# Patient Record
Sex: Female | Born: 1970 | Race: Black or African American | Hispanic: No | Marital: Single | Smoking: Never smoker
Health system: Southern US, Community
[De-identification: ages and names within clinical notes are randomized; demographics above are authoritative.]

---

## 2010-10-11 ENCOUNTER — Emergency Department (HOSPITAL_COMMUNITY)
Admission: EM | Admit: 2010-10-11 | Discharge: 2010-10-11 | Disposition: A | Discharge status: Home / Self Care | Payer: BC Managed Care – PPO | Attending: Emergency Medicine | Admitting: Emergency Medicine

## 2010-10-11 DIAGNOSIS — R609 Edema, unspecified: Secondary | ICD-10-CM | POA: Insufficient documentation

## 2010-10-11 DIAGNOSIS — M7989 Other specified soft tissue disorders: Secondary | ICD-10-CM | POA: Insufficient documentation

## 2010-10-11 LAB — POCT I-STAT, CHEM 8
BUN: 13 mg/dL (ref 6–23)
Calcium, Ion: 1.12 mmol/L (ref 1.12–1.32)
Chloride: 105 mEq/L (ref 96–112)
Glucose, Bld: 85 mg/dL (ref 70–99)
TCO2: 22 mmol/L (ref 0–100)

## 2010-10-12 ENCOUNTER — Other Ambulatory Visit: Payer: Self-pay | Admitting: Emergency Medicine

## 2010-10-12 ENCOUNTER — Ambulatory Visit (HOSPITAL_COMMUNITY)
Admission: AD | Admit: 2010-10-12 | Discharge: 2010-10-12 | Disposition: A | Discharge status: Home / Self Care | Payer: BC Managed Care – PPO | Source: Ambulatory Visit | Attending: Emergency Medicine | Admitting: Emergency Medicine

## 2010-10-12 DIAGNOSIS — M79609 Pain in unspecified limb: Secondary | ICD-10-CM | POA: Insufficient documentation

## 2010-10-12 DIAGNOSIS — M7989 Other specified soft tissue disorders: Secondary | ICD-10-CM | POA: Insufficient documentation

## 2016-07-13 ENCOUNTER — Ambulatory Visit (HOSPITAL_COMMUNITY)
Admission: EM | Admit: 2016-07-13 | Discharge: 2016-07-13 | Disposition: A | Discharge status: Home / Self Care | Payer: Managed Care, Other (non HMO) | Attending: Family Medicine | Admitting: Family Medicine

## 2016-07-13 ENCOUNTER — Encounter (HOSPITAL_COMMUNITY): Payer: Self-pay | Admitting: Emergency Medicine

## 2016-07-13 DIAGNOSIS — N760 Acute vaginitis: Secondary | ICD-10-CM

## 2016-07-13 DIAGNOSIS — Z79899 Other long term (current) drug therapy: Secondary | ICD-10-CM | POA: Diagnosis not present

## 2016-07-13 DIAGNOSIS — M549 Dorsalgia, unspecified: Secondary | ICD-10-CM | POA: Diagnosis present

## 2016-07-13 DIAGNOSIS — M545 Low back pain: Secondary | ICD-10-CM | POA: Insufficient documentation

## 2016-07-13 LAB — POCT URINALYSIS DIP (DEVICE)
Bilirubin Urine: NEGATIVE
GLUCOSE, UA: NEGATIVE mg/dL
Hgb urine dipstick: NEGATIVE
Ketones, ur: NEGATIVE mg/dL
LEUKOCYTES UA: NEGATIVE
NITRITE: NEGATIVE
Protein, ur: NEGATIVE mg/dL
SPECIFIC GRAVITY, URINE: 1.02 (ref 1.005–1.030)
UROBILINOGEN UA: 4 mg/dL — AB (ref 0.0–1.0)
pH: 7.5 (ref 5.0–8.0)

## 2016-07-13 MED ORDER — FLUCONAZOLE 150 MG PO TABS: 150.0000 mg | ORAL_TABLET | Freq: Once | ORAL | 0 refills | Status: AC

## 2016-07-13 NOTE — ED Provider Notes (Signed)
[facility]    CSN: [id number] Arrival date & time: 07/13/16  1716     History   Chief Complaint Chief Complaint  Patient presents with  . Back Pain    HPI Heidi Reyes is a 45 y.o. female.   This is a 45 year old female (woman) who comes in low back pain which she thinks may be a urinary infection. She does not have dysuria or frequency.      History reviewed. No pertinent past medical history.  There are no active problems to display for this patient.   History reviewed. No pertinent surgical history.  OB History    No data available       Home Medications    Prior to Admission medications   Medication Sig Start Date End Date Taking? Authorizing Provider  cholecalciferol (VITAMIN D) 1000 units tablet Take 1,000 Units by mouth daily.   Yes Historical Provider, MD  valACYclovir (VALTREX) 500 MG tablet Take 500 mg by mouth 2 (two) times daily.   Yes Historical Provider, MD  fluconazole (DIFLUCAN) 150 MG tablet Take 1 tablet (150 mg total) by mouth once. Repeat if needed 07/13/16 07/13/16  Elvina SidleKurt Tamma Brigandi, MD    Family History History reviewed. No pertinent family history.  Social History Social History  Substance Use Topics  . Smoking status: Never Smoker  . Smokeless tobacco: Never Used  . Alcohol use Yes     Comment: Occ     Allergies   Patient has no known allergies.   Review of Systems Review of Systems  Constitutional: Negative.   HENT: Negative.   Gastrointestinal: Negative.   Genitourinary: Positive for vaginal discharge and vaginal pain.  Musculoskeletal: Positive for back pain.     Physical Exam Triage Vital Signs ED Triage Vitals [07/13/16 1727]  Enc Vitals Group     BP 128/74     Pulse Rate 68     Resp 16     Temp 98.8 F (37.1 C)     Temp Source Oral     SpO2 100 %     Weight      Height      Head Circumference      Peak Flow      Pain Score      Pain Loc      Pain Edu?      Excl. in GC?    No  data found.   Updated Vital Signs BP 128/74 (BP Location: Left Arm)   Pulse 68   Temp 98.8 F (37.1 C) (Oral)   Resp 16   LMP 06/26/2016 (Exact Date)   SpO2 100%   Physical Exam  Constitutional: She is oriented to person, place, and time. She appears well-developed and well-nourished.  HENT:  Right Ear: External ear normal.  Left Ear: External ear normal.  Eyes: Conjunctivae and EOM are normal.  Neck: Normal range of motion. Neck supple.  Pulmonary/Chest: Effort normal.  Abdominal: Soft. There is no tenderness.  Musculoskeletal: Normal range of motion.  Neurological: She is alert and oriented to person, place, and time.  Skin: Skin is warm and dry.  Nursing note and vitals reviewed.  Normal external hematoma genitalia, vagina is reddened with small amount of white discharge.  UC Treatments / Results  Labs (all labs ordered are listed, but only abnormal results are displayed) Labs Reviewed  POCT URINALYSIS DIP (DEVICE) - Abnormal; Notable for the following:       Result Value   Urobilinogen, UA 4.0 (*)  All other components within normal limits  URINE CYTOLOGY ANCILLARY ONLY    EKG  EKG Interpretation None       Radiology No results found.  Procedures Procedures (including critical care time)  Medications Ordered in UC Medications - No data to display   Initial Impression / Assessment and Plan / UC Course  I have reviewed the triage vital signs and the nursing notes.  Pertinent labs & imaging results that were available during my care of the patient were reviewed by me and considered in my medical decision making (see chart for details).  Clinical Course     Final Clinical Impressions(s) / UC Diagnoses   Final diagnoses:  Acute vaginitis    New Prescriptions New Prescriptions   FLUCONAZOLE (DIFLUCAN) 150 MG TABLET    Take 1 tablet (150 mg total) by mouth once. Repeat if needed     Elvina SidleKurt Sally Menard, MD 07/13/16 1752

## 2016-07-13 NOTE — ED Triage Notes (Signed)
[facility] a [facility] lower [facility] pain x 2 days. The patient denied any dysuria or frequency however did report a hx of UTI's and stated that the symptoms today are consistent.

## 2016-07-16 LAB — URINE CYTOLOGY ANCILLARY ONLY
Chlamydia: NEGATIVE
Neisseria Gonorrhea: NEGATIVE
Trichomonas: NEGATIVE

## 2016-07-17 LAB — URINE CYTOLOGY ANCILLARY ONLY: Candida vaginitis: NEGATIVE

## 2016-07-18 ENCOUNTER — Telehealth: Payer: Self-pay | Admitting: Internal Medicine

## 2016-07-18 MED ORDER — METRONIDAZOLE 500 MG PO TABS: 500.0000 mg | ORAL_TABLET | Freq: Two times a day (BID) | ORAL | 0 refills | Status: DC

## 2016-07-18 NOTE — Telephone Encounter (Signed)
Clinical staff, please let patient know that test for bacterial vaginosis was positive.  Symptoms of vaginal irritation/discharge reported in visit note.  Rx metronidazole was sent to the pharmacy of record, [facility] on [address].  Recheck for further evaluation if symptoms persist.  LM

## 2017-03-04 ENCOUNTER — Other Ambulatory Visit: Payer: Self-pay | Admitting: Obstetrics and Gynecology

## 2017-03-04 DIAGNOSIS — Z1231 Encounter for screening mammogram for malignant neoplasm of breast: Secondary | ICD-10-CM

## 2017-03-16 ENCOUNTER — Ambulatory Visit
Admission: RE | Admit: 2017-03-16 | Discharge: 2017-03-16 | Disposition: A | Discharge status: Home / Self Care | Payer: 59 | Source: Ambulatory Visit | Attending: Obstetrics and Gynecology | Admitting: Obstetrics and Gynecology

## 2017-03-16 DIAGNOSIS — Z1231 Encounter for screening mammogram for malignant neoplasm of breast: Secondary | ICD-10-CM

## 2017-04-22 ENCOUNTER — Other Ambulatory Visit: Payer: Self-pay | Admitting: Podiatry

## 2017-04-22 DIAGNOSIS — M659 Synovitis and tenosynovitis, unspecified: Secondary | ICD-10-CM

## 2017-04-22 DIAGNOSIS — M25572 Pain in left ankle and joints of left foot: Principal | ICD-10-CM

## 2017-04-22 DIAGNOSIS — G8929 Other chronic pain: Secondary | ICD-10-CM

## 2017-05-02 ENCOUNTER — Ambulatory Visit
Admission: RE | Admit: 2017-05-02 | Discharge: 2017-05-02 | Disposition: A | Discharge status: Home / Self Care | Payer: 59 | Source: Ambulatory Visit | Attending: Podiatry | Admitting: Podiatry

## 2017-05-02 DIAGNOSIS — M659 Synovitis and tenosynovitis, unspecified: Secondary | ICD-10-CM

## 2017-05-02 DIAGNOSIS — M25572 Pain in left ankle and joints of left foot: Principal | ICD-10-CM

## 2017-05-02 DIAGNOSIS — G8929 Other chronic pain: Secondary | ICD-10-CM

## 2017-05-02 MED ORDER — GADOBENATE DIMEGLUMINE 529 MG/ML IV SOLN
15.0000 mL | Freq: Once | INTRAVENOUS | Status: AC | PRN
  Administered 2017-05-02: 15 mL via INTRAVENOUS

## 2017-06-30 ENCOUNTER — Other Ambulatory Visit: Payer: Self-pay | Admitting: Family Medicine

## 2017-06-30 DIAGNOSIS — R600 Localized edema: Secondary | ICD-10-CM

## 2017-07-03 ENCOUNTER — Other Ambulatory Visit: Payer: 59

## 2017-07-07 ENCOUNTER — Ambulatory Visit
Admission: RE | Admit: 2017-07-07 | Discharge: 2017-07-07 | Disposition: A | Discharge status: Home / Self Care | Payer: 59 | Source: Ambulatory Visit | Attending: Family Medicine | Admitting: Family Medicine

## 2017-07-07 DIAGNOSIS — R600 Localized edema: Secondary | ICD-10-CM

## 2017-07-07 MED ORDER — IOPAMIDOL (ISOVUE-370) INJECTION 76%
100.0000 mL | Freq: Once | INTRAVENOUS | Status: AC | PRN
  Administered 2017-07-07: 100 mL via INTRAVENOUS

## 2017-11-19 ENCOUNTER — Encounter (HOSPITAL_COMMUNITY): Payer: Self-pay | Admitting: Family Medicine

## 2017-11-19 ENCOUNTER — Ambulatory Visit (HOSPITAL_COMMUNITY)
Admission: EM | Admit: 2017-11-19 | Discharge: 2017-11-19 | Disposition: A | Discharge status: Home / Self Care | Payer: 59 | Attending: Family Medicine | Admitting: Family Medicine

## 2017-11-19 DIAGNOSIS — M79605 Pain in left leg: Secondary | ICD-10-CM

## 2017-11-19 MED ORDER — INDOMETHACIN 50 MG PO CAPS: 50.0000 mg | ORAL_CAPSULE | Freq: Three times a day (TID) | ORAL | 0 refills | Status: DC

## 2017-11-19 NOTE — ED Triage Notes (Signed)
Pt here for LLE pain and swelling. The swelling has been ongoing for a while but most recently the pain started. The pain is mostly in calf.

## 2017-11-19 NOTE — ED Provider Notes (Signed)
[facility]   [id number] 11/19/17 Arrival Time: 1756   SUBJECTIVE:  Heidi Reyes is a 46 y.o. female who presents to the urgent care with complaint of LLE pain and swelling. The swelling has been ongoing for a while but most recently the pain started. The pain is mostly in calf.   Not taking birth control or smoking  Has had leg swelling in past but never pain.  No right leg swelling or pain.  No chest pain or shortness of breath  Work involves sitting all day.  History reviewed. No pertinent past medical history. History reviewed. No pertinent family history. Social History   Socioeconomic History  . Marital status: Single    Spouse name: Not on file  . Number of children: Not on file  . Years of education: Not on file  . Highest education level: Not on file  Occupational History  . Not on file  Social Needs  . Financial resource strain: Not on file  . Food insecurity:    Worry: Not on file    Inability: Not on file  . Transportation needs:    Medical: Not on file    Non-medical: Not on file  Tobacco Use  . Smoking status: Never Smoker  . Smokeless tobacco: Never Used  Substance and Sexual Activity  . Alcohol use: Yes    Comment: Occ  . Drug use: No  . Sexual activity: Not on file  Lifestyle  . Physical activity:    Days per week: Not on file    Minutes per session: Not on file  . Stress: Not on file  Relationships  . Social connections:    Talks on phone: Not on file    Gets together: Not on file    Attends religious service: Not on file    Active member of club or organization: Not on file    Attends meetings of clubs or organizations: Not on file    Relationship status: Not on file  . Intimate partner violence:    Fear of current or ex partner: Not on file    Emotionally abused: Not on file    Physically abused: Not on file    Forced sexual activity: Not on file  Other Topics Concern  . Not on file  Social History Narrative  . Not on  file   No outpatient medications have been marked as taking for the 11/19/17 encounter [facility] Encounter).   No Known Allergies    ROS: As per HPI, remainder of ROS negative.   OBJECTIVE:   Vitals:   11/19/17 1821  BP: 123/73  Pulse: 61  Resp: 18  Temp: 98.1 F (36.7 C)  SpO2: 98%     General appearance: alert; no distress Eyes: PERRL; EOMI; conjunctiva normal HENT: normocephalic; atraumatic; oral mucosa normal Neck: supple Lungs: clear to auscultation bilaterally Heart: regular rate and rhythm Back: no CVA tenderness Extremities: no cyanosis but LLE is swollen and tender in the calf Skin: warm and dry Neurologic: normal gait; grossly normal Psychological: alert and cooperative; normal mood and affect      Labs:  Results for orders placed or performed during the hospital encounter of 07/13/16  POCT urinalysis dip (device)  Result Value Ref Range   Glucose, UA NEGATIVE NEGATIVE mg/dL   Bilirubin Urine NEGATIVE NEGATIVE   Ketones, ur NEGATIVE NEGATIVE mg/dL   Specific Gravity, Urine 1.020 1.005 - 1.030   Hgb urine dipstick NEGATIVE NEGATIVE   pH 7.5 5.0 - 8.0  Protein, ur NEGATIVE NEGATIVE mg/dL   Urobilinogen, UA 4.0 (H) 0.0 - 1.0 mg/dL   Nitrite NEGATIVE NEGATIVE   Leukocytes, UA NEGATIVE NEGATIVE  Urine cytology ancillary only  Result Value Ref Range   Chlamydia Negative    Neisseria gonorrhea Negative    Trichomonas Negative   Urine cytology ancillary only  Result Value Ref Range   Bacterial vaginitis (A)     **POSITIVE for Atopobium vaginae POSITIVE for BVAB2 POSITIVE for Gardnerella vaginalis POSITIVE for Megasphaera 1 POSITIVE for Megasphaera 2**   Candida vaginitis Negative for Candida Vaginitis Microorganisms     Labs Reviewed - No data to display  No results found.     ASSESSMENT & PLAN:  1. Left leg pain   Go to [facility] admitting tomorrow at 8 am and tell them you have been referred for a venous doppler to rule out clots in  the left leg.  Meds ordered this encounter  Medications  . indomethacin (INDOCIN) 50 MG capsule    Sig: Take 1 capsule (50 mg total) by mouth 3 (three) times daily with meals.    Dispense:  10 capsule    Refill:  0    Reviewed expectations re: course of current medical issues. Questions answered. Outlined signs and symptoms indicating need for more acute intervention. Patient verbalized understanding. After Visit Summary given.    Procedures:      Elvina Sidle, MD 11/19/17 [phone]

## 2017-11-19 NOTE — Discharge Instructions (Addendum)
Go to [facility] admitting tomorrow at 8 am and tell them you have been referred for a venous doppler to rule out clots in the left leg.

## 2017-11-23 ENCOUNTER — Ambulatory Visit (HOSPITAL_COMMUNITY)
Admission: RE | Admit: 2017-11-23 | Discharge: 2017-11-23 | Disposition: A | Discharge status: Home / Self Care | Payer: 59 | Source: Ambulatory Visit | Attending: Family Medicine | Admitting: Family Medicine

## 2017-11-23 DIAGNOSIS — M7989 Other specified soft tissue disorders: Secondary | ICD-10-CM

## 2017-11-23 DIAGNOSIS — M79662 Pain in left lower leg: Secondary | ICD-10-CM | POA: Diagnosis not present

## 2017-11-23 DIAGNOSIS — M79609 Pain in unspecified limb: Secondary | ICD-10-CM | POA: Diagnosis not present

## 2017-11-23 NOTE — Progress Notes (Signed)
VASCULAR LAB PRELIMINARY  PRELIMINARY  PRELIMINARY  PRELIMINARY  Left lower extremity venous duplex completed.    Preliminary report: There is no DVT or SVT noted in the left lower extremity.   Number left was for radiology and not Dr. Curley Spice, [facility], RVT 11/23/2017, 10:18 AM

## 2017-12-07 ENCOUNTER — Ambulatory Visit: Payer: Self-pay | Admitting: Family Medicine

## 2017-12-09 ENCOUNTER — Ambulatory Visit (INDEPENDENT_AMBULATORY_CARE_PROVIDER_SITE_OTHER): Payer: 59 | Admitting: Family Medicine

## 2017-12-09 ENCOUNTER — Other Ambulatory Visit: Payer: Self-pay

## 2017-12-09 ENCOUNTER — Encounter: Payer: Self-pay | Admitting: Family Medicine

## 2017-12-09 VITALS — BP 114/79 | HR 55 | Temp 98.7°F | Resp 16 | Ht 66.5 in | Wt 159.4 lb

## 2017-12-09 DIAGNOSIS — K5790 Diverticulosis of intestine, part unspecified, without perforation or abscess without bleeding: Secondary | ICD-10-CM | POA: Diagnosis not present

## 2017-12-09 DIAGNOSIS — R600 Localized edema: Secondary | ICD-10-CM | POA: Diagnosis not present

## 2017-12-09 DIAGNOSIS — K5901 Slow transit constipation: Secondary | ICD-10-CM | POA: Diagnosis not present

## 2017-12-09 NOTE — Progress Notes (Signed)
Chief Complaint  Patient presents with  . New Patient (Initial Visit)    establish care.  Right foot swelling around toes    HPI  Left lower extremity edema She has been seen by PT, Chiropractor, Podiatry, Neurology She had x-rays, she had ultrasounds She states that if she has been standing or walking her left foot, ankle and calf get swollen She reports that her left ankle is always bigger than her right She reports that her work up has been negative for a cause of her swelling She was taking meloxicam for pain and inflammation She reports that her pain was more from the swelling She saw orthopedics for left hip pain with Orthopedics and received a corticosteroid shot for the left hip pain and was given a steroid shot for bursitis.  This has been an ongoing problem for 5 years   She sits on her day job where she sits but she also has a part time job where she stands She states that she stands   Constipation She reports that she has a BM and only has a small amount and may not go for 3-4 days She used to drink prune juice from time to time She has tried fiber supplement but did not like the texture She has never tried a pill version  She drinks water  She does not go more than 5 days without a BM She reports that her stool is small and balls She states that she has increased her greens She is exercising more but stopped due to the left leg pain  Her CT angiogram shows diverticulosis     No past medical history on file.  Current Outpatient Medications  Medication Sig Dispense Refill  . cholecalciferol (VITAMIN D) 1000 units tablet Take 1,000 Units by mouth daily.    . indomethacin (INDOCIN) 50 MG capsule Take 1 capsule (50 mg total) by mouth 3 (three) times daily with meals. (Patient not taking: Reported on 12/09/2017) 10 capsule 0   No current facility-administered medications for this visit.     Allergies: No Known Allergies  No past surgical history on  file.  Social History   Socioeconomic History  . Marital status: Single    Spouse name: Not on file  . Number of children: Not on file  . Years of education: Not on file  . Highest education level: Not on file  Occupational History  . Not on file  Social Needs  . Financial resource strain: Not on file  . Food insecurity:    Worry: Not on file    Inability: Not on file  . Transportation needs:    Medical: Not on file    Non-medical: Not on file  Tobacco Use  . Smoking status: Never Smoker  . Smokeless tobacco: Never Used  Substance and Sexual Activity  . Alcohol use: Yes    Comment: Occ  . Drug use: No  . Sexual activity: Not on file  Lifestyle  . Physical activity:    Days per week: Not on file    Minutes per session: Not on file  . Stress: Not on file  Relationships  . Social connections:    Talks on phone: Not on file    Gets together: Not on file    Attends religious service: Not on file    Active member of club or organization: Not on file    Attends meetings of clubs or organizations: Not on file    Relationship status: Not on  file  Other Topics Concern  . Not on file  Social History Narrative  . Not on file    No family history on file.   ROS Review of Systems See HPI Constitution: No fevers or chills No malaise No diaphoresis Skin: No rash or itching Eyes: no blurry vision, no double vision GU: no dysuria or hematuria Neuro: no dizziness or headaches * all others reviewed and negative   Objective: Vitals:   12/09/17 1102  BP: 114/79  Pulse: (!) 55  Resp: 16  Temp: 98.7 F (37.1 C)  TempSrc: Oral  SpO2: 100%  Weight: 159 lb 6.4 oz (72.3 kg)  Height: 5' 6.5" (1.689 m)    Physical Exam  Constitutional: She is oriented to person, place, and time. She appears well-developed and well-nourished.  HENT:  Head: Normocephalic and atraumatic.  Eyes: Conjunctivae and EOM are normal.  Neck: Normal range of motion. Neck supple.   Cardiovascular: Normal rate, regular rhythm and normal heart sounds.  No murmur heard. Pulses:      Dorsalis pedis pulses are 2+ on the right side, and 2+ on the left side.       Posterior tibial pulses are 2+ on the right side, and 2+ on the left side.  Pulmonary/Chest: Effort normal and breath sounds normal. No stridor. No respiratory distress.  Abdominal: Soft. Bowel sounds are normal. She exhibits no distension and no mass. There is no tenderness. There is no guarding.  Musculoskeletal:       Right foot: There is normal range of motion and no deformity.       Left foot: There is normal range of motion and no deformity.  Feet:  Right Foot:  Protective Sensation: 2 sites tested. 2 sites sensed.  Left Foot:  Protective Sensation: 2 sites tested. 2 sites sensed.  Neurological: She is alert and oriented to person, place, and time.  Skin: Skin is warm. Capillary refill takes less than 2 seconds.  Psychiatric: She has a normal mood and affect. Her behavior is normal. Judgment and thought content normal.      IMPRESSION: VASCULAR 1. No acute or significant arterial or venous abnormality.  NON-VASCULAR 1. Colonic diverticular disease without CT evidence of active inflammation. 2. Focal L5-S1 degenerative disc disease.  Signed, Sterling Big, MD [facility] Electronically Signed   By: Malachy Moan M.D.   On: 07/07/2017 16:35   IMPRESSION Subcutaneous edema about the ankle. The study is otherwise negative. No tendon or ligament abnormality is identified. Electronically Signed   By: Drusilla Kanner M.D.   On: 05/02/2017 13:32    Assessment and Plan Heidi Reyes was seen today for new patient (initial visit).  Diagnoses and all orders for this visit:  Edema of left lower extremity- all other work up negative. Will check autoimmune causes -     ANA w/Reflex if Positive -     Rheumatoid factor -     CBC with  Differential/Platelet -     Basic metabolic panel  Slow transit constipation- discussed fiber supplementation and increase hydration   Diverticulosis- reviewed findings of CT abd Discussed treating constipation     Heidi Reyes

## 2017-12-09 NOTE — Patient Instructions (Addendum)
Add on a fiber capsule and a probiotic capsule to help with constipation  If your immune labs are normal we should try an as needed water pill for the days when your leg is really swollen Avoid salty foods Continue compression stockings     IF you received an x-ray today, you will receive an invoice from [facility]. Please contact [facility] at [phone] with questions or concerns regarding your invoice.   IF you received labwork today, you will receive an invoice from [facility]. Please contact [facility] at [phone] with questions or concerns regarding your invoice.   Our billing staff will not be able to assist you with questions regarding bills from these companies.  You will be contacted with the lab results as soon as they are available. The fastest way to get your results is to activate your My Chart account. Instructions are located on the last page of this paperwork. If you have not heard from [place] regarding the results in 2 weeks, please contact this office.     Constipation, Adult Constipation is when a person has fewer bowel movements in a week than normal, has difficulty having a bowel movement, or has stools that are dry, hard, or larger than normal. Constipation may be caused by an underlying condition. It may become worse with age if a person takes certain medicines and does not take in enough fluids. Follow these instructions at home: Eating and drinking   Eat foods that have a lot of fiber, such as fresh fruits and vegetables, whole grains, and beans.  Limit foods that are high in fat, low in fiber, or overly processed, such as french fries, hamburgers, cookies, candies, and soda.  Drink enough fluid to keep your urine clear or pale yellow. General instructions  Exercise regularly or as told by your health care provider.  Go to the restroom when you have the urge to go. Do not hold it in.  Take over-the-counter and prescription medicines only  as told by your health care provider. These include any fiber supplements.  Practice pelvic floor retraining exercises, such as deep breathing while relaxing the lower abdomen and pelvic floor relaxation during bowel movements.  Watch your condition for any changes.  Keep all follow-up visits as told by your health care provider. This is important. Contact a health care provider if:  You have pain that gets worse.  You have a fever.  You do not have a bowel movement after 4 days.  You vomit.  You are not hungry.  You lose weight.  You are bleeding from the anus.  You have thin, pencil-like stools. Get help right away if:  You have a fever and your symptoms suddenly get worse.  You leak stool or have blood in your stool.  Your abdomen is bloated.  You have severe pain in your abdomen.  You feel dizzy or you faint. This information is not intended to replace advice given to you by your health care provider. Make sure you discuss any questions you have with your health care provider. Document Released: 04/04/2004 Document Revised: 01/25/2016 Document Reviewed: 12/26/2015 Elsevier Interactive Patient Education  2018 [employer].  Diverticulosis Diverticulosis is a condition that develops when small pouches (diverticula) form in the wall of the large intestine (colon). The colon is where water is absorbed and stool is formed. The pouches form when the inside layer of the colon pushes through weak spots in the outer layers of the colon. You may have a few  pouches or many of them. What are the causes? The cause of this condition is not known. What increases the risk? The following factors may make you more likely to develop this condition:  Being older than age 50. Your risk for this condition increases with age. Diverticulosis is rare among people younger than age 76. By age 67, many people have it.  Eating a low-fiber diet.  Having frequent constipation.  Being  overweight.  Not getting enough exercise.  Smoking.  Taking over-the-counter pain medicines, like aspirin and ibuprofen.  Having a family history of diverticulosis.  What are the signs or symptoms? In most people, there are no symptoms of this condition. If you do have symptoms, they may include:  Bloating.  Cramps in the abdomen.  Constipation or diarrhea.  Pain in the lower left side of the abdomen.  How is this diagnosed? This condition is most often diagnosed during an exam for other colon problems. Because diverticulosis usually has no symptoms, it often cannot be diagnosed independently. This condition may be diagnosed by:  Using a flexible scope to examine the colon (colonoscopy).  Taking an X-ray of the colon after dye has been put into the colon (barium enema).  Doing a CT scan.  How is this treated? You may not need treatment for this condition if you have never developed an infection related to diverticulosis. If you have had an infection before, treatment may include:  Eating a high-fiber diet. This may include eating more fruits, vegetables, and grains.  Taking a fiber supplement.  Taking a live bacteria supplement (probiotic).  Taking medicine to relax your colon.  Taking antibiotic medicines.  Follow these instructions at home:  Drink 6-8 glasses of water or more each day to prevent constipation.  Try not to strain when you have a bowel movement.  If you have had an infection before: ? Eat more fiber as directed by your health care provider or your diet and nutrition specialist (dietitian). ? Take a fiber supplement or probiotic, if your health care provider approves.  Take over-the-counter and prescription medicines only as told by your health care provider.  If you were prescribed an antibiotic, take it as told by your health care provider. Do not stop taking the antibiotic even if you start to feel better.  Keep all follow-up visits as told  by your health care provider. This is important. Contact a health care provider if:  You have pain in your abdomen.  You have bloating.  You have cramps.  You have not had a bowel movement in 3 days. Get help right away if:  Your pain gets worse.  Your bloating becomes very bad.  You have a fever or chills, and your symptoms suddenly get worse.  You vomit.  You have bowel movements that are bloody or black.  You have bleeding from your rectum. Summary  Diverticulosis is a condition that develops when small pouches (diverticula) form in the wall of the large intestine (colon).  You may have a few pouches or many of them.  This condition is most often diagnosed during an exam for other colon problems.  If you have had an infection related to diverticulosis, treatment may include increasing the fiber in your diet, taking supplements, or taking medicines. This information is not intended to replace advice given to you by your health care provider. Make sure you discuss any questions you have with your health care provider. Document Released: 04/03/2004 Document Revised: 05/26/2016 Document Reviewed: 05/26/2016  Elsevier Interactive Patient Education  2017 Elsevier Inc.  

## 2017-12-10 LAB — BASIC METABOLIC PANEL
BUN / CREAT RATIO: 29 — AB (ref 9–23)
BUN: 19 mg/dL (ref 6–24)
CHLORIDE: 104 mmol/L (ref 96–106)
CO2: 22 mmol/L (ref 20–29)
Calcium: 9.7 mg/dL (ref 8.7–10.2)
Creatinine, Ser: 0.66 mg/dL (ref 0.57–1.00)
GFR calc Af Amer: 124 mL/min/{1.73_m2} (ref >59)
GFR calc non Af Amer: 108 mL/min/{1.73_m2} (ref >59)
GLUCOSE: 86 mg/dL (ref 65–99)
Potassium: 4.1 mmol/L (ref 3.5–5.2)
SODIUM: 141 mmol/L (ref 134–144)

## 2017-12-10 LAB — CBC WITH DIFFERENTIAL/PLATELET
BASOS ABS: 0 10*3/uL (ref 0.0–0.2)
Basos: 0 %
EOS (ABSOLUTE): 0 10*3/uL (ref 0.0–0.4)
Eos: 1 %
Hematocrit: 40 % (ref 34.0–46.6)
Hemoglobin: 13.3 g/dL (ref 11.1–15.9)
IMMATURE GRANS (ABS): 0 10*3/uL (ref 0.0–0.1)
Immature Granulocytes: 0 %
LYMPHS ABS: 2.2 10*3/uL (ref 0.7–3.1)
LYMPHS: 59 %
MCH: 29.2 pg (ref 26.6–33.0)
MCHC: 33.3 g/dL (ref 31.5–35.7)
MCV: 88 fL (ref 79–97)
MONOCYTES: 7 %
Monocytes Absolute: 0.3 10*3/uL (ref 0.1–0.9)
Neutrophils Absolute: 1.2 10*3/uL — ABNORMAL LOW (ref 1.4–7.0)
Neutrophils: 33 %
PLATELETS: 199 10*3/uL (ref 150–450)
RBC: 4.56 x10E6/uL (ref 3.77–5.28)
RDW: 14 % (ref 12.3–15.4)
WBC: 3.8 10*3/uL (ref 3.4–10.8)

## 2017-12-10 LAB — RHEUMATOID FACTOR: Rhuematoid fact SerPl-aCnc: <10 IU/mL (ref 0.0–13.9)

## 2017-12-10 LAB — ANA W/REFLEX IF POSITIVE: Anti Nuclear Antibody(ANA): NEGATIVE

## 2018-07-20 ENCOUNTER — Other Ambulatory Visit: Payer: Self-pay | Admitting: Obstetrics and Gynecology

## 2018-07-20 DIAGNOSIS — Z1231 Encounter for screening mammogram for malignant neoplasm of breast: Secondary | ICD-10-CM

## 2018-08-25 ENCOUNTER — Ambulatory Visit
Admission: RE | Admit: 2018-08-25 | Discharge: 2018-08-25 | Disposition: A | Discharge status: Home / Self Care | Payer: 59 | Source: Ambulatory Visit | Attending: Obstetrics and Gynecology | Admitting: Obstetrics and Gynecology

## 2018-08-25 ENCOUNTER — Telehealth: Payer: Self-pay | Admitting: *Deleted

## 2018-08-25 DIAGNOSIS — Z1231 Encounter for screening mammogram for malignant neoplasm of breast: Secondary | ICD-10-CM

## 2018-08-25 NOTE — Telephone Encounter (Signed)
Heidi Reyes, I made an appt for a CPE for this pt this morning. There is a pt with a Wellness visit the sameday which needs to be rescheduled (that being the reason that I made the CPE for this pt - the AWV will be cancelled and put on your schedule) . I will be sending a separate note to you regarding the other pt.   Thanks!   Kenney Houseman

## 2018-10-01 ENCOUNTER — Encounter: Payer: Self-pay | Admitting: Family Medicine

## 2018-10-22 ENCOUNTER — Ambulatory Visit: Payer: 59 | Admitting: Family Medicine

## 2018-11-15 ENCOUNTER — Other Ambulatory Visit: Payer: Self-pay

## 2018-11-15 DIAGNOSIS — Z1329 Encounter for screening for other suspected endocrine disorder: Secondary | ICD-10-CM

## 2018-11-15 DIAGNOSIS — Z114 Encounter for screening for human immunodeficiency virus [HIV]: Secondary | ICD-10-CM

## 2018-11-15 DIAGNOSIS — Z113 Encounter for screening for infections with a predominantly sexual mode of transmission: Secondary | ICD-10-CM

## 2018-11-15 DIAGNOSIS — Z13220 Encounter for screening for lipoid disorders: Secondary | ICD-10-CM

## 2018-11-15 DIAGNOSIS — Z139 Encounter for screening, unspecified: Secondary | ICD-10-CM

## 2018-11-15 DIAGNOSIS — Z299 Encounter for prophylactic measures, unspecified: Secondary | ICD-10-CM

## 2018-11-18 ENCOUNTER — Encounter: Payer: 59 | Admitting: Family Medicine

## 2018-12-15 NOTE — Progress Notes (Signed)
This encounter was created in error - please disregard.

## 2019-07-06 DIAGNOSIS — Z20828 Contact with and (suspected) exposure to other viral communicable diseases: Secondary | ICD-10-CM | POA: Diagnosis not present

## 2019-08-10 ENCOUNTER — Other Ambulatory Visit: Payer: Self-pay | Admitting: Family Medicine

## 2019-08-10 DIAGNOSIS — Z1231 Encounter for screening mammogram for malignant neoplasm of breast: Secondary | ICD-10-CM

## 2019-08-19 ENCOUNTER — Ambulatory Visit
Admission: RE | Admit: 2019-08-19 | Discharge: 2019-08-19 | Disposition: A | Discharge status: Home / Self Care | Payer: 59 | Source: Ambulatory Visit | Attending: Family Medicine | Admitting: Family Medicine

## 2019-08-19 ENCOUNTER — Other Ambulatory Visit: Payer: Self-pay

## 2019-08-19 DIAGNOSIS — Z1231 Encounter for screening mammogram for malignant neoplasm of breast: Secondary | ICD-10-CM

## 2019-09-03 DIAGNOSIS — Z13220 Encounter for screening for lipoid disorders: Secondary | ICD-10-CM | POA: Diagnosis not present

## 2019-09-03 DIAGNOSIS — Z131 Encounter for screening for diabetes mellitus: Secondary | ICD-10-CM | POA: Diagnosis not present

## 2019-09-03 DIAGNOSIS — Z6830 Body mass index (BMI) 30.0-30.9, adult: Secondary | ICD-10-CM | POA: Diagnosis not present

## 2019-09-03 DIAGNOSIS — Z713 Dietary counseling and surveillance: Secondary | ICD-10-CM | POA: Diagnosis not present

## 2020-03-28 DIAGNOSIS — H11431 Conjunctival hyperemia, right eye: Secondary | ICD-10-CM | POA: Diagnosis not present

## 2020-03-28 DIAGNOSIS — H16141 Punctate keratitis, right eye: Secondary | ICD-10-CM | POA: Diagnosis not present

## 2020-04-27 DIAGNOSIS — N63 Unspecified lump in unspecified breast: Secondary | ICD-10-CM | POA: Diagnosis not present

## 2020-04-27 DIAGNOSIS — N644 Mastodynia: Secondary | ICD-10-CM | POA: Diagnosis not present

## 2020-05-02 ENCOUNTER — Other Ambulatory Visit: Payer: Self-pay | Admitting: Internal Medicine

## 2020-05-02 DIAGNOSIS — N63 Unspecified lump in unspecified breast: Secondary | ICD-10-CM

## 2020-05-02 DIAGNOSIS — N644 Mastodynia: Secondary | ICD-10-CM

## 2020-05-04 DIAGNOSIS — Z0000 Encounter for general adult medical examination without abnormal findings: Secondary | ICD-10-CM | POA: Diagnosis not present

## 2020-05-09 DIAGNOSIS — R635 Abnormal weight gain: Secondary | ICD-10-CM | POA: Diagnosis not present

## 2020-05-09 DIAGNOSIS — Z6830 Body mass index (BMI) 30.0-30.9, adult: Secondary | ICD-10-CM | POA: Diagnosis not present

## 2020-05-09 DIAGNOSIS — R634 Abnormal weight loss: Secondary | ICD-10-CM | POA: Diagnosis not present

## 2020-05-23 ENCOUNTER — Other Ambulatory Visit: Payer: Self-pay

## 2020-05-23 ENCOUNTER — Ambulatory Visit: Payer: 59

## 2020-05-23 ENCOUNTER — Ambulatory Visit
Admission: RE | Admit: 2020-05-23 | Discharge: 2020-05-23 | Disposition: A | Discharge status: Home / Self Care | Payer: No Typology Code available for payment source | Source: Ambulatory Visit | Attending: Internal Medicine | Admitting: Internal Medicine

## 2020-05-23 DIAGNOSIS — N63 Unspecified lump in unspecified breast: Secondary | ICD-10-CM

## 2020-05-23 DIAGNOSIS — N644 Mastodynia: Secondary | ICD-10-CM

## 2020-05-23 DIAGNOSIS — R922 Inconclusive mammogram: Secondary | ICD-10-CM | POA: Diagnosis not present

## 2020-06-21 DIAGNOSIS — Z0000 Encounter for general adult medical examination without abnormal findings: Secondary | ICD-10-CM | POA: Diagnosis not present

## 2020-07-28 ENCOUNTER — Ambulatory Visit: Payer: No Typology Code available for payment source | Attending: Internal Medicine

## 2020-07-28 DIAGNOSIS — Z23 Encounter for immunization: Secondary | ICD-10-CM

## 2020-07-28 NOTE — Progress Notes (Signed)
   Covid-19 Vaccination Clinic  Name:  Heidi Reyes    MRN: [id number] DOB: [birth year]  07/28/2020  Heidi Reyes was observed post Covid-19 immunization for 15 minutes without incident. She was provided with Vaccine Information Sheet and instruction to access the V-Safe system.   Heidi Reyes was instructed to call 911 with any severe reactions post vaccine: Marland Kitchen Difficulty breathing  . Swelling of face and throat  . A fast heartbeat  . A bad rash all over body  . Dizziness and weakness   Immunizations Administered    Name Date Dose VIS Date Route   Pfizer COVID-19 Vaccine 07/28/2020 11:47 AM 0.3 mL 05/09/2020 Intramuscular   Manufacturer: [employer], [employer]   Lot: [id number]   NDC: [address]-2

## 2020-10-03 DIAGNOSIS — Z01812 Encounter for preprocedural laboratory examination: Secondary | ICD-10-CM | POA: Diagnosis not present

## 2020-10-05 DIAGNOSIS — K5730 Diverticulosis of large intestine without perforation or abscess without bleeding: Secondary | ICD-10-CM | POA: Diagnosis not present

## 2020-10-05 DIAGNOSIS — K635 Polyp of colon: Secondary | ICD-10-CM | POA: Diagnosis not present

## 2020-10-05 DIAGNOSIS — Z1211 Encounter for screening for malignant neoplasm of colon: Secondary | ICD-10-CM | POA: Diagnosis not present

## 2020-10-09 DIAGNOSIS — K635 Polyp of colon: Secondary | ICD-10-CM | POA: Diagnosis not present

## 2021-06-10 DIAGNOSIS — M5451 Vertebrogenic low back pain: Secondary | ICD-10-CM | POA: Diagnosis not present

## 2021-06-20 DIAGNOSIS — Z0000 Encounter for general adult medical examination without abnormal findings: Secondary | ICD-10-CM | POA: Diagnosis not present

## 2021-06-27 DIAGNOSIS — E038 Other specified hypothyroidism: Secondary | ICD-10-CM | POA: Diagnosis not present

## 2021-06-27 DIAGNOSIS — R69 Illness, unspecified: Secondary | ICD-10-CM | POA: Diagnosis not present

## 2021-06-27 DIAGNOSIS — Z0000 Encounter for general adult medical examination without abnormal findings: Secondary | ICD-10-CM | POA: Diagnosis not present

## 2021-06-27 DIAGNOSIS — N951 Menopausal and female climacteric states: Secondary | ICD-10-CM | POA: Diagnosis not present

## 2022-01-07 ENCOUNTER — Other Ambulatory Visit: Payer: Self-pay | Admitting: Internal Medicine

## 2022-01-07 DIAGNOSIS — Z1231 Encounter for screening mammogram for malignant neoplasm of breast: Secondary | ICD-10-CM

## 2022-01-15 ENCOUNTER — Ambulatory Visit
Admission: RE | Admit: 2022-01-15 | Discharge: 2022-01-15 | Disposition: A | Discharge status: Home / Self Care | Payer: 59 | Source: Ambulatory Visit | Attending: Internal Medicine | Admitting: Internal Medicine

## 2022-01-15 DIAGNOSIS — Z1231 Encounter for screening mammogram for malignant neoplasm of breast: Secondary | ICD-10-CM

## 2022-04-22 IMAGING — MG MM DIGITAL DIAGNOSTIC UNILAT*L* W/ TOMO W/ CAD
4 series · 4 of 12 positions shown · non-contrast
Comparison: Previous exam(s).

CLINICAL DATA: Intermittent, diffuse, sharp, shooting pain
throughout the left breast for the past year.

EXAM:
DIGITAL DIAGNOSTIC UNILATERAL LEFT MAMMOGRAM WITH TOMO AND CAD

[L MLO synth-2D]
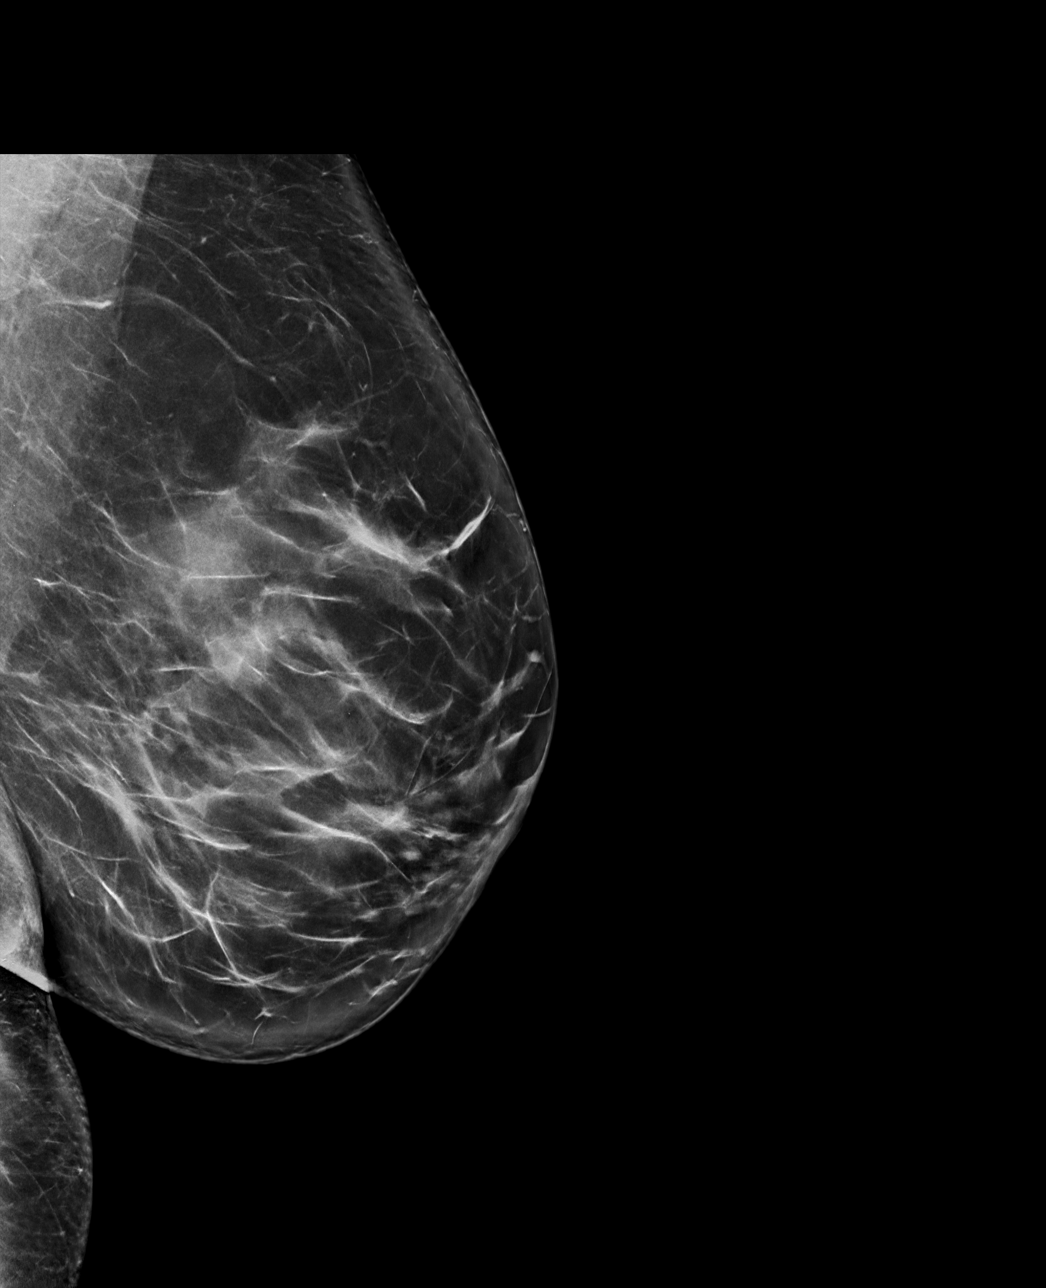

[L CC synth-2D]
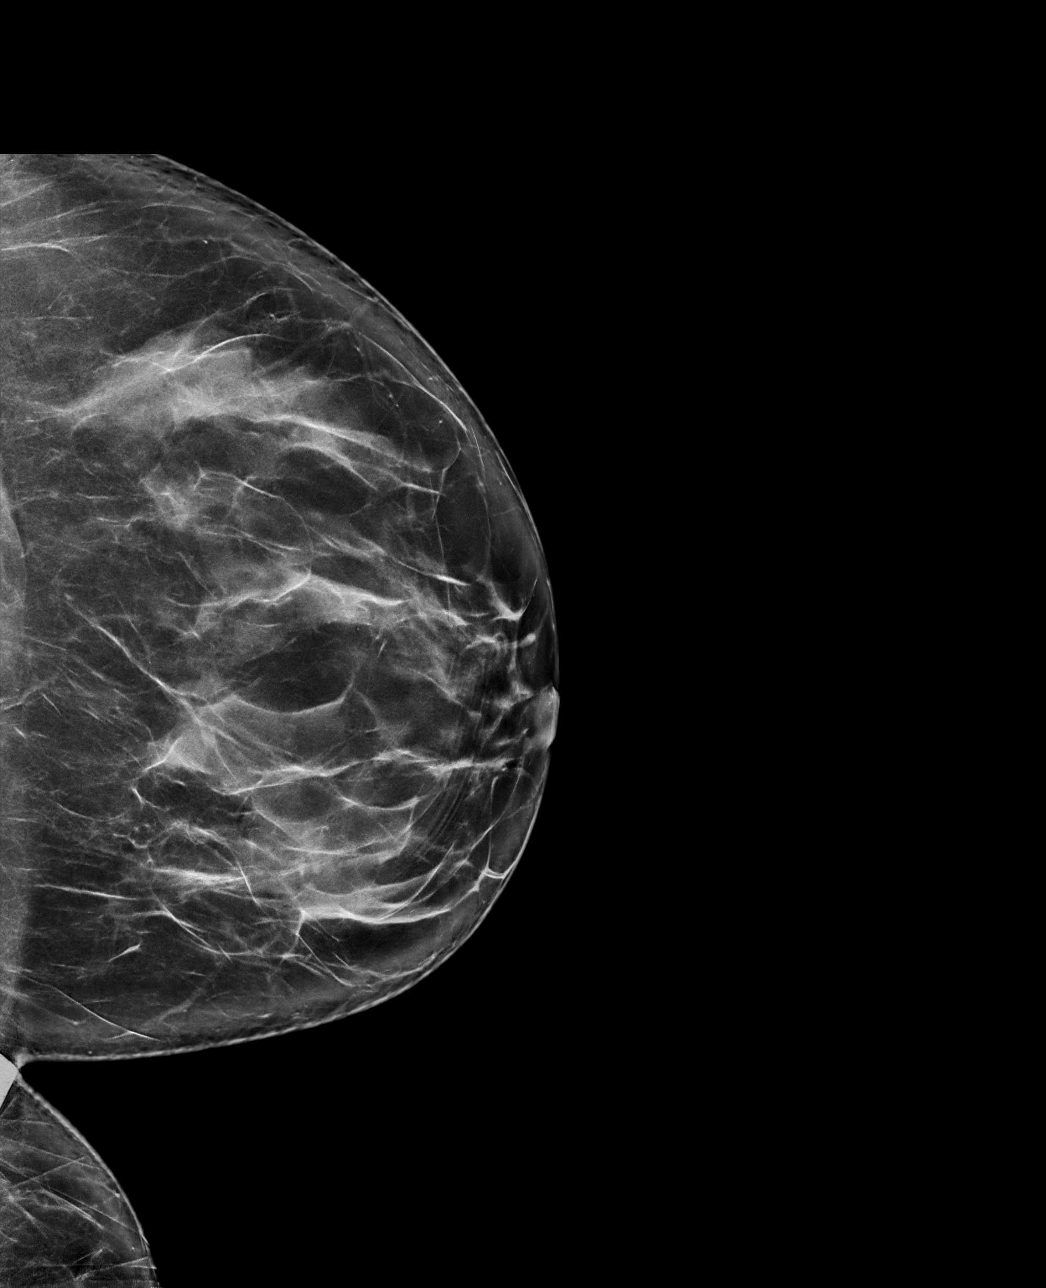

[L MLO tomo · tomo slice 45/90.0]
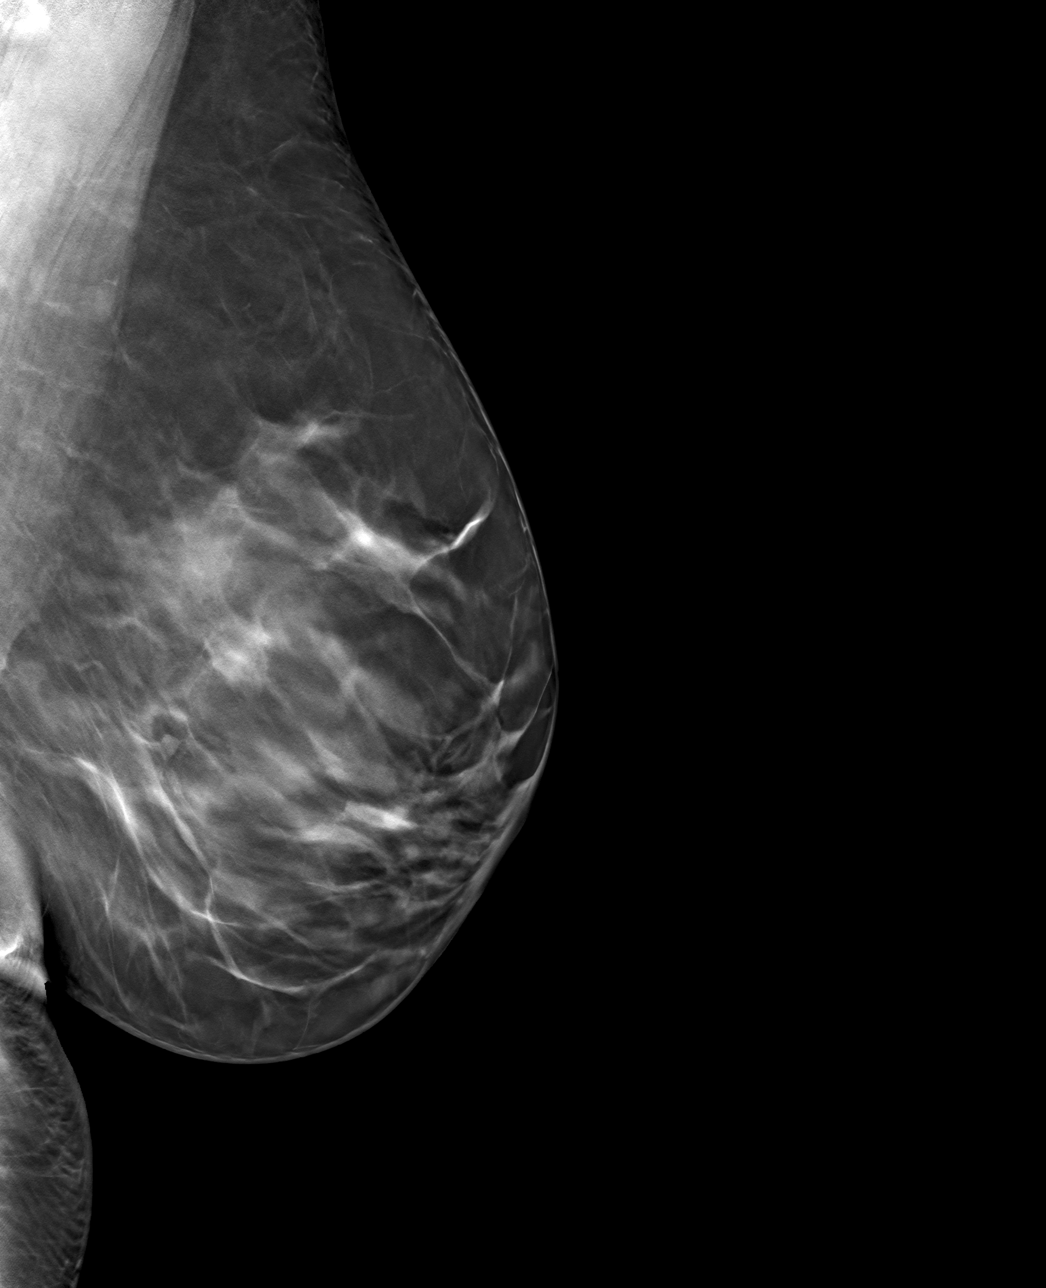

[L CC tomo · tomo slice 45/89.0]
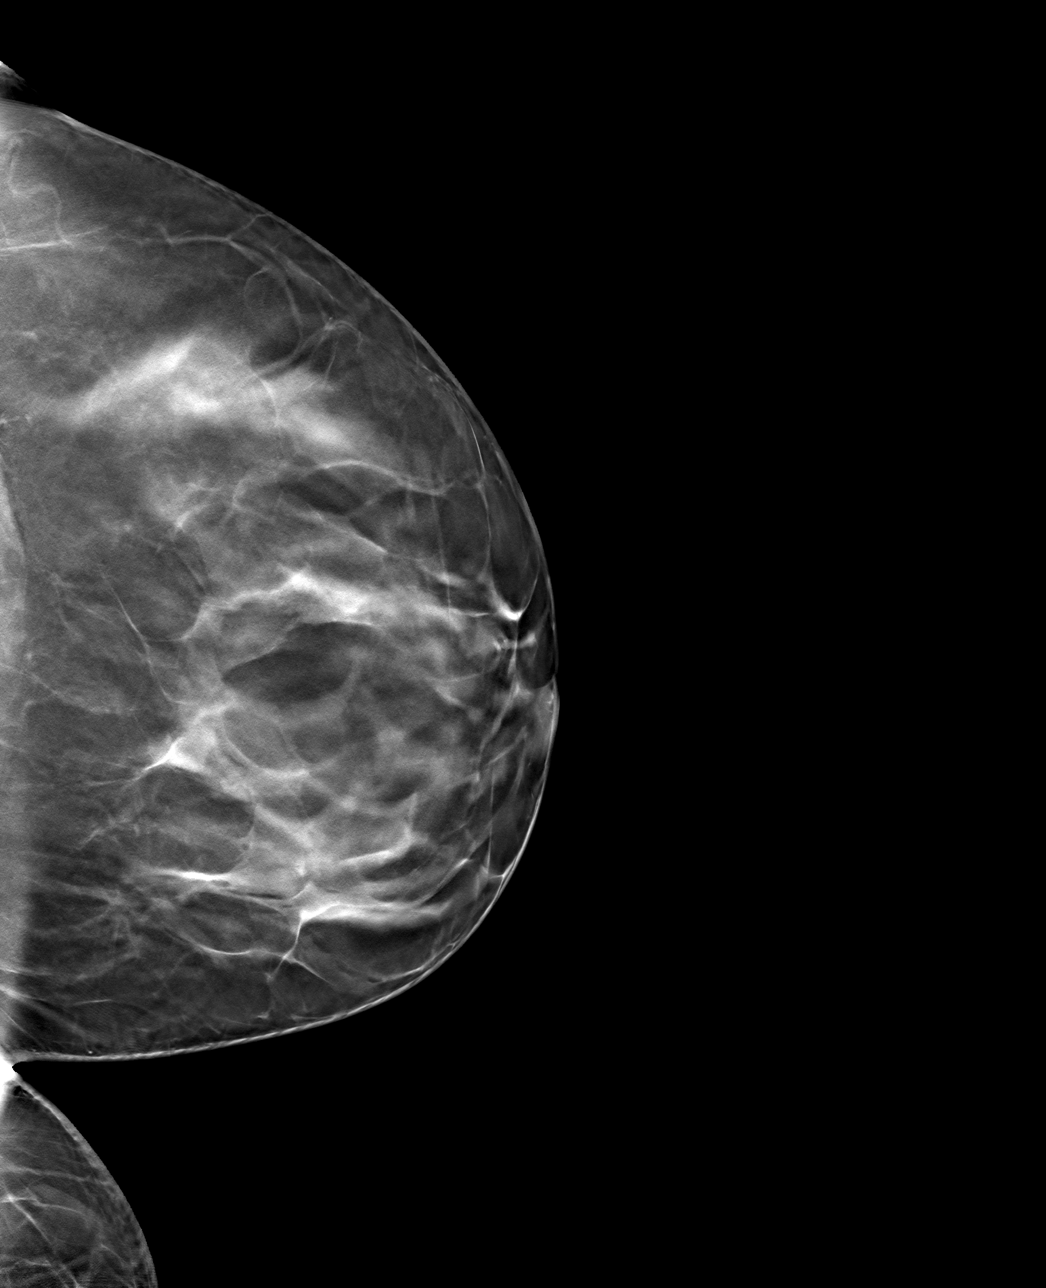

[4 of 12 positions shown; findings below may reference images not displayed]

ACR Breast Density Category c: The breast tissue is heterogeneously
dense, which may obscure small masses.
FINDINGS: Stable mammographic appearance of the left breast with no interval
findings suspicious for malignancy.

Mammographic images were processed with CAD.
IMPRESSION: No evidence of malignancy.

RECOMMENDATION:
Bilateral screening mammogram in 3 months when due. That will be 1
year since mammographic evaluation of the right breast.

I have discussed the findings and recommendations with the patient.
If applicable, a reminder letter will be sent to the patient
regarding the next appointment.

BI-RADS CATEGORY  1: Negative.

## 2022-06-26 DIAGNOSIS — Z0000 Encounter for general adult medical examination without abnormal findings: Secondary | ICD-10-CM | POA: Diagnosis not present

## 2022-07-03 DIAGNOSIS — Z0000 Encounter for general adult medical examination without abnormal findings: Secondary | ICD-10-CM | POA: Diagnosis not present

## 2022-07-03 DIAGNOSIS — E038 Other specified hypothyroidism: Secondary | ICD-10-CM | POA: Diagnosis not present

## 2022-09-25 DIAGNOSIS — R519 Headache, unspecified: Secondary | ICD-10-CM | POA: Diagnosis not present

## 2022-10-02 DIAGNOSIS — D229 Melanocytic nevi, unspecified: Secondary | ICD-10-CM | POA: Diagnosis not present

## 2022-12-08 DIAGNOSIS — D485 Neoplasm of uncertain behavior of skin: Secondary | ICD-10-CM | POA: Diagnosis not present

## 2022-12-08 DIAGNOSIS — D224 Melanocytic nevi of scalp and neck: Secondary | ICD-10-CM | POA: Diagnosis not present

## 2023-04-22 ENCOUNTER — Other Ambulatory Visit: Payer: Self-pay | Admitting: Internal Medicine

## 2023-04-22 DIAGNOSIS — Z1231 Encounter for screening mammogram for malignant neoplasm of breast: Secondary | ICD-10-CM

## 2023-04-25 ENCOUNTER — Ambulatory Visit
Admission: RE | Admit: 2023-04-25 | Discharge: 2023-04-25 | Disposition: A | Discharge status: Home / Self Care | Payer: 59 | Source: Ambulatory Visit | Attending: Internal Medicine | Admitting: Internal Medicine

## 2023-04-25 DIAGNOSIS — Z1231 Encounter for screening mammogram for malignant neoplasm of breast: Secondary | ICD-10-CM | POA: Diagnosis not present

## 2023-04-28 ENCOUNTER — Encounter: Payer: Self-pay | Admitting: Family Medicine

## 2023-04-28 ENCOUNTER — Telehealth: Payer: Self-pay | Admitting: Emergency Medicine

## 2023-04-28 ENCOUNTER — Ambulatory Visit (INDEPENDENT_AMBULATORY_CARE_PROVIDER_SITE_OTHER): Payer: 59 | Admitting: Family Medicine

## 2023-04-28 ENCOUNTER — Other Ambulatory Visit: Payer: Self-pay | Admitting: Family Medicine

## 2023-04-28 ENCOUNTER — Telehealth: Payer: Self-pay | Admitting: *Deleted

## 2023-04-28 VITALS — BP 128/83 | HR 90 | Temp 97.9°F | Resp 16 | Ht 67.0 in | Wt 192.0 lb

## 2023-04-28 DIAGNOSIS — N951 Menopausal and female climacteric states: Secondary | ICD-10-CM

## 2023-04-28 DIAGNOSIS — Z7689 Persons encountering health services in other specified circumstances: Secondary | ICD-10-CM | POA: Diagnosis not present

## 2023-04-28 MED ORDER — PAROXETINE MESYLATE 7.5 MG PO CAPS: 7.5000 mg | ORAL_CAPSULE | Freq: Every day | ORAL | 1 refills | Status: AC

## 2023-04-28 NOTE — Telephone Encounter (Signed)
Pharmacy comment: Alternative Requested:NOT COVERED BY PATIENTS INSURNACE.

## 2023-04-28 NOTE — Progress Notes (Signed)
New Patient Office Visit  Subjective    Patient ID: Heidi Reyes, female    DOB: 10-07-1970  Age: 52 y.o. MRN: [id number]  CC:  Chief Complaint  Patient presents with   Establish Care    Menopause   Hot Flashes    HPI Heidi Reyes presents to establish care and for complaint of menopausal sx. She did have a hysterectomy in 2018.  Has been having sx for about 6 months after hysterectomy but they are worsening.    Outpatient Encounter Medications as of 04/28/2023  Medication Sig   cholecalciferol (VITAMIN D) 1000 units tablet Take 1,000 Units by mouth daily.   PARoxetine Mesylate 7.5 MG CAPS Take 7.5 mg by mouth daily.   acetaminophen (TYLENOL) 650 MG CR tablet Take 650 mg by mouth every 8 (eight) hours as needed. (Patient not taking: Reported on 04/28/2023)   indomethacin (INDOCIN) 50 MG capsule Take 1 capsule (50 mg total) by mouth 3 (three) times daily with meals. (Patient not taking: Reported on 04/28/2023)   No facility-administered encounter medications on file as of 04/28/2023.    History reviewed. No pertinent past medical history.  History reviewed. No pertinent surgical history.  Family History  Problem Relation Age of Onset   Breast cancer Maternal Aunt    Breast cancer Paternal Aunt    Breast cancer Maternal Grandmother    Breast cancer Cousin     Social History   Socioeconomic History   Marital status: Single    Spouse name: Not on file   Number of children: 1   Years of education: 14   Highest education level: Associate degree: occupational, Scientist, product/process development, or vocational program  Occupational History   Not on file  Tobacco Use   Smoking status: Never   Smokeless tobacco: Never  Vaping Use   Vaping status: Never Used  Substance and Sexual Activity   Alcohol use: Yes    Alcohol/week: 1.0 standard drink of alcohol    Types: 1 Glasses of wine per week    Comment: Occ   Drug use: No   Sexual activity: Not Currently  Other Topics Concern   Not on  file  Social History Narrative   Not on file   Social Determinants of Health   Financial Resource Strain: Low Risk  (04/28/2023)   Overall Financial Resource Strain (CARDIA)    Difficulty of Paying Living Expenses: Not hard at all  Food Insecurity: No Food Insecurity (04/28/2023)   Hunger Vital Sign    Worried About Running Out of Food in the Last Year: Never true    Ran Out of Food in the Last Year: Never true  Transportation Needs: No Transportation Needs (04/28/2023)   PRAPARE - Administrator, Civil Service (Medical): No    Lack of Transportation (Non-Medical): No  Physical Activity: Inactive (04/28/2023)   Exercise Vital Sign    Days of Exercise per Week: 0 days    Minutes of Exercise per Session: 0 min  Stress: No Stress Concern Present (04/28/2023)   [employer] of Occupational Health - Occupational Stress Questionnaire    Feeling of Stress : Not at all  Social Connections: Moderately Isolated (04/28/2023)   Social Connection and Isolation Panel [NHANES]    Frequency of Communication with Friends and Family: More than three times a week    Frequency of Social Gatherings with Friends and Family: More than three times a week    Attends Religious Services: More than 4 times per year  Active Member of Clubs or Organizations: No    Attends Banker Meetings: Never    Marital Status: Never married  Intimate Partner Violence: Not At Risk (04/28/2023)   Humiliation, Afraid, Rape, and Kick questionnaire    Fear of Current or Ex-Partner: No    Emotionally Abused: No    Physically Abused: No    Sexually Abused: No    Review of Systems  All other systems reviewed and are negative.       Objective    BP 128/83 (BP Location: Right Arm, Patient Position: Sitting, Cuff Size: Normal)   Pulse 90   Temp 97.9 F (36.6 C) (Oral)   Resp 16   Ht 5\' 7"  (1.702 m)   Wt 192 lb (87.1 kg)   LMP 06/26/2016 (Exact Date)   SpO2 94%   BMI 30.07 kg/m    Physical Exam Vitals and nursing note reviewed.  Constitutional:      General: She is not in acute distress. Cardiovascular:     Rate and Rhythm: Normal rate and regular rhythm.  Pulmonary:     Effort: Pulmonary effort is normal.     Breath sounds: Normal breath sounds.  Neurological:     General: No focal deficit present.     Mental Status: She is alert and oriented to person, place, and time.  Psychiatric:        Mood and Affect: Mood normal.        Behavior: Behavior normal.         Assessment & Plan:  1. Perimenopausal vasomotor symptoms Discussed at length. Brisella prescribed. Monitor.   2. Encounter to establish care    No follow-ups on file.   Tommie Raymond, MD

## 2023-04-28 NOTE — Telephone Encounter (Signed)
I called patient about medication.  She is going to try the samples of [place].  She will pick them up tomorrow at the front desk.

## 2023-04-28 NOTE — Telephone Encounter (Signed)
Requested medication (s) are due for refill today: No  Requested medication (s) are on the active medication list: Yes  Last refill:  04/28/23  Future visit scheduled: Yes  Notes to clinic:  Pharmacy requests alternative.    Requested Prescriptions  Pending Prescriptions Disp Refills   PARoxetine Mesylate 7.5 MG CAPS [Pharmacy Med Name: PAROXETINE MESYLATE 7.5 MG CAP] 30 capsule 1    Sig: Take 7.5 mg by mouth daily.     Psychiatry:  Antidepressants - SSRI Passed - 04/28/2023 11:27 AM      Passed - Valid encounter within last 6 months    Recent Outpatient Visits           Today Perimenopausal vasomotor symptoms   [facility], Lauris Poag, MD   5 years ago Edema of left lower extremity   [facility], Manus Rudd, MD       Future Appointments             In 1 month Georganna Skeans, MD [facility]

## 2023-06-01 ENCOUNTER — Ambulatory Visit: Payer: 59 | Admitting: Family Medicine

## 2023-08-17 DIAGNOSIS — E038 Other specified hypothyroidism: Secondary | ICD-10-CM | POA: Diagnosis not present

## 2023-08-17 DIAGNOSIS — Z0000 Encounter for general adult medical examination without abnormal findings: Secondary | ICD-10-CM | POA: Diagnosis not present

## 2023-08-24 ENCOUNTER — Other Ambulatory Visit: Payer: Self-pay | Admitting: Internal Medicine

## 2023-08-24 DIAGNOSIS — N6322 Unspecified lump in the left breast, upper inner quadrant: Secondary | ICD-10-CM | POA: Diagnosis not present

## 2023-08-24 DIAGNOSIS — Z0000 Encounter for general adult medical examination without abnormal findings: Secondary | ICD-10-CM | POA: Diagnosis not present

## 2023-08-24 DIAGNOSIS — E038 Other specified hypothyroidism: Secondary | ICD-10-CM | POA: Diagnosis not present

## 2023-09-02 ENCOUNTER — Ambulatory Visit
Admission: RE | Admit: 2023-09-02 | Discharge: 2023-09-02 | Disposition: A | Discharge status: Home / Self Care | Payer: 59 | Source: Ambulatory Visit | Attending: Internal Medicine | Admitting: Internal Medicine

## 2023-09-02 DIAGNOSIS — N6322 Unspecified lump in the left breast, upper inner quadrant: Secondary | ICD-10-CM

## 2023-09-02 DIAGNOSIS — N644 Mastodynia: Secondary | ICD-10-CM | POA: Diagnosis not present

## 2024-01-17 ENCOUNTER — Emergency Department (HOSPITAL_COMMUNITY)
Admission: EM | Admit: 2024-01-17 | Discharge: 2024-01-18 | Disposition: A | Discharge status: Home / Self Care | Attending: Emergency Medicine | Admitting: Emergency Medicine

## 2024-01-17 ENCOUNTER — Emergency Department (HOSPITAL_COMMUNITY)

## 2024-01-17 ENCOUNTER — Encounter (HOSPITAL_COMMUNITY): Payer: Self-pay

## 2024-01-17 ENCOUNTER — Other Ambulatory Visit: Payer: Self-pay

## 2024-01-17 DIAGNOSIS — R1032 Left lower quadrant pain: Secondary | ICD-10-CM | POA: Diagnosis present

## 2024-01-17 DIAGNOSIS — K5792 Diverticulitis of intestine, part unspecified, without perforation or abscess without bleeding: Secondary | ICD-10-CM

## 2024-01-17 DIAGNOSIS — R002 Palpitations: Secondary | ICD-10-CM | POA: Insufficient documentation

## 2024-01-17 DIAGNOSIS — E039 Hypothyroidism, unspecified: Secondary | ICD-10-CM | POA: Insufficient documentation

## 2024-01-17 DIAGNOSIS — R0602 Shortness of breath: Secondary | ICD-10-CM | POA: Diagnosis not present

## 2024-01-17 DIAGNOSIS — K5732 Diverticulitis of large intestine without perforation or abscess without bleeding: Secondary | ICD-10-CM | POA: Diagnosis not present

## 2024-01-17 LAB — TROPONIN I (HIGH SENSITIVITY)
Troponin I (High Sensitivity): 3 ng/L (ref <18)
Troponin I (High Sensitivity): 3 ng/L (ref <18)

## 2024-01-17 LAB — URINALYSIS, ROUTINE W REFLEX MICROSCOPIC
Bacteria, UA: NONE SEEN
Bilirubin Urine: NEGATIVE
Glucose, UA: NEGATIVE mg/dL
Hgb urine dipstick: NEGATIVE
Ketones, ur: 20 mg/dL — AB
Nitrite: NEGATIVE
Protein, ur: NEGATIVE mg/dL
Specific Gravity, Urine: 1.021 (ref 1.005–1.030)
pH: 5 (ref 5.0–8.0)

## 2024-01-17 LAB — BASIC METABOLIC PANEL WITH GFR
Anion gap: 11 (ref 5–15)
BUN: 12 mg/dL (ref 6–20)
CO2: 21 mmol/L — ABNORMAL LOW (ref 22–32)
Calcium: 9.1 mg/dL (ref 8.9–10.3)
Chloride: 104 mmol/L (ref 98–111)
Creatinine, Ser: 0.79 mg/dL (ref 0.44–1.00)
GFR, Estimated: >60 mL/min (ref >60)
Glucose, Bld: 91 mg/dL (ref 70–99)
Potassium: 3.9 mmol/L (ref 3.5–5.1)
Sodium: 136 mmol/L (ref 135–145)

## 2024-01-17 LAB — CBC
HCT: 42.1 % (ref 36.0–46.0)
Hemoglobin: 13.5 g/dL (ref 12.0–15.0)
MCH: 29 pg (ref 26.0–34.0)
MCHC: 32.1 g/dL (ref 30.0–36.0)
MCV: 90.3 fL (ref 80.0–100.0)
Platelets: 198 10*3/uL (ref 150–400)
RBC: 4.66 MIL/uL (ref 3.87–5.11)
RDW: 12.1 % (ref 11.5–15.5)
WBC: 10.7 10*3/uL — ABNORMAL HIGH (ref 4.0–10.5)
nRBC: 0 % (ref 0.0–0.2)

## 2024-01-17 NOTE — ED Notes (Addendum)
 Pt stated her pain has gotten worse and is requesting pain meds at this time. Triage nurse Bonney, RN) notified.

## 2024-01-17 NOTE — ED Triage Notes (Signed)
 Pt came in via POV d/t all day having intermittent SOB/CP that radiates to her Lt breast & severe abd pain. Does feel nauseous, no emesis. Also endorses her watch said her pulse was ranging from 110-167 bpm. A/Ox4, rates her pain 7/10 during triage.

## 2024-01-18 ENCOUNTER — Emergency Department (HOSPITAL_COMMUNITY)

## 2024-01-18 LAB — TSH: TSH: 3.596 u[IU]/mL (ref 0.350–4.500)

## 2024-01-18 MED ORDER — AMOXICILLIN-POT CLAVULANATE 875-125 MG PO TABS
1.0000 | ORAL_TABLET | Freq: Two times a day (BID) | ORAL | 0 refills | Status: AC
Start: 2024-01-18 — End: ?

## 2024-01-18 MED ORDER — IOHEXOL 350 MG/ML SOLN
125.0000 mL | Freq: Once | INTRAVENOUS | Status: AC | PRN
  Administered 2024-01-18: 125 mL via INTRAVENOUS

## 2024-01-18 MED ORDER — AMOXICILLIN-POT CLAVULANATE 875-125 MG PO TABS
1.0000 | ORAL_TABLET | Freq: Once | ORAL | Status: AC
Start: 2024-01-18 — End: 2024-01-18
  Administered 2024-01-18: 1 via ORAL
  Filled 2024-01-18: qty 1

## 2024-01-18 NOTE — Discharge Instructions (Addendum)
 Take antibiotics as prescribed and complete the full course.  Please follow-up with your doctor, consider follow-up colonoscopy.  Referral to cardiology today, they should be calling you to schedule this appointment.  Return to the ER for any worsening or concerning symptoms.

## 2024-01-18 NOTE — ED Provider Notes (Signed)
 [facility] AT [facility] Provider Note   CSN: [id number] Arrival date & time: 01/17/24  8251     Patient presents with: Shortness of Breath, Chest Pain, and Abdominal Pain   Heidi Reyes is a 53 y.o. female.   53 year old female with a PMH of hysterectomy in 2017 with a cc of left breast pain, SOB and LLQ pain. Patient states that she has this pain on and off for a year, however, today she has had sharp, constant pain starting at 11am/noon today that worse than usual and was accompanied with SOB and tachycardia. She states her watch said she was tachy in the 160s and she felt like I was running.  Episode lasted indeterminate amount of time, recurrent. Patient symptoms also include nausea and LLQ pain. Her breast pain is worse with pressing on it. She says she has seen a doctor regarding her breast and they ordered an mammogram and it said she had dense tissue and that is why she is having pain. She denies radiating pain to arm or back and denies taking any pain meds due to lack of pain meds. She said Tylenol or motrin usually helps the pain but she did not have any today. No personal or family history of PE or DVT. Recent trip to [place] (flew and drove), no leg pain (chronic left leg swelling unchanged), non smoker, no hormone therapy.        Prior to Admission medications   Medication Sig Start Date End Date Taking? Authorizing Provider  amoxicillin-clavulanate (AUGMENTIN) 875-125 MG tablet Take 1 tablet by mouth every 12 (twelve) hours. 01/18/24  Yes Beverley Leita LABOR, PA-C  cholecalciferol (VITAMIN D) 1000 units tablet Take 1,000 Units by mouth daily.    [provider]  PARoxetine  Mesylate 7.5 MG CAPS Take 7.5 mg by mouth daily. 04/28/23   Tanda Bleacher, MD    Allergies: Patient has no known allergies.    Review of Systems Negative except as per HPI Updated Vital Signs BP 114/79   Pulse 81   Temp 99.3 F (37.4 C) (Oral)   Resp 17    Ht 5' 7 (1.702 m)   Wt 88 kg   LMP 06/26/2016 (Exact Date)   SpO2 98%   BMI 30.38 kg/m   Physical Exam  (all labs ordered are listed, but only abnormal results are displayed) Labs Reviewed  BASIC METABOLIC PANEL WITH GFR - Abnormal; Notable for the following components:      Result Value   CO2 21 (*)    All other components within normal limits  CBC - Abnormal; Notable for the following components:   WBC 10.7 (*)    All other components within normal limits  URINALYSIS, ROUTINE W REFLEX MICROSCOPIC - Abnormal; Notable for the following components:   APPearance HAZY (*)    Ketones, ur 20 (*)    Leukocytes,Ua SMALL (*)    All other components within normal limits  TSH  TROPONIN I (HIGH SENSITIVITY)  TROPONIN I (HIGH SENSITIVITY)    EKG: None  Radiology: CT Angio Chest PE W/Cm &/Or Wo Cm Result Date: 01/18/2024 CLINICAL DATA:  PE suspected. Left lower quadrant abdominal pain. Chest pain and shortness of breath. EXAM: CT ANGIOGRAPHY CHEST CT ABDOMEN AND PELVIS WITH CONTRAST TECHNIQUE: Multidetector CT imaging of the chest was performed using the standard protocol during bolus administration of intravenous contrast. Multiplanar CT image reconstructions and MIPs were obtained to evaluate the vascular anatomy. Multidetector CT imaging of  the abdomen and pelvis was performed using the standard protocol during bolus administration of intravenous contrast. RADIATION DOSE REDUCTION: This exam was performed according to the departmental dose-optimization program which includes automated exposure control, adjustment of the mA and/or kV according to patient size and/or use of iterative reconstruction technique. CONTRAST:  OMNIPAQUE IOHEXOL 350 MG/ML SOLN COMPARISON:  Chest radiograph 01/17/2024 and CT abdomen pelvis 07/07/2017 FINDINGS: CTA CHEST FINDINGS Cardiovascular: Normal heart size. No pericardial effusion. Normal caliber thoracic aorta without dissection. Negative for acute  pulmonary embolism. Mediastinum/Nodes: Trachea and esophagus are unremarkable. No thoracic adenopathy. Lungs/Pleura: Mild atelectasis in the lower lungs. No pleural effusion or pneumothorax. Musculoskeletal: No acute fracture. Review of the MIP images confirms the above findings. CT ABDOMEN and PELVIS FINDINGS Hepatobiliary: No acute abnormality. Pancreas: Unremarkable. Spleen: Unremarkable. Adrenals/Urinary Tract: Normal adrenal glands. No urinary calculi or hydronephrosis. Unremarkable bladder. Stomach/Bowel: Stomach is within normal limits. No bowel obstruction. Extensive diverticulosis about the sigmoid and descending colon. Marked wall thickening and adjacent mild stranding and free fluid about the descending colon compatible with diverticulitis. No free intraperitoneal air. No abscess. Normal appendix. Vascular/Lymphatic: No significant vascular findings are present. No enlarged abdominal or pelvic lymph nodes. Reproductive: Status post hysterectomy. No adnexal masses. Other: None. Musculoskeletal: No acute fracture. Review of the MIP images confirms the above findings. IMPRESSION: 1. Acute uncomplicated diverticulitis of the descending colon. Consider colonoscopy after resolution of acute symptoms to exclude underlying mass. 2. Negative for acute pulmonary embolism. Electronically Signed   By: Norman Gatlin M.D.   On: 01/18/2024 02:14   CT ABDOMEN PELVIS W CONTRAST Result Date: 01/18/2024 CLINICAL DATA:  PE suspected. Left lower quadrant abdominal pain. Chest pain and shortness of breath. EXAM: CT ANGIOGRAPHY CHEST CT ABDOMEN AND PELVIS WITH CONTRAST TECHNIQUE: Multidetector CT imaging of the chest was performed using the standard protocol during bolus administration of intravenous contrast. Multiplanar CT image reconstructions and MIPs were obtained to evaluate the vascular anatomy. Multidetector CT imaging of the abdomen and pelvis was performed using the standard protocol during bolus administration  of intravenous contrast. RADIATION DOSE REDUCTION: This exam was performed according to the departmental dose-optimization program which includes automated exposure control, adjustment of the mA and/or kV according to patient size and/or use of iterative reconstruction technique. CONTRAST:  OMNIPAQUE IOHEXOL 350 MG/ML SOLN COMPARISON:  Chest radiograph 01/17/2024 and CT abdomen pelvis 07/07/2017 FINDINGS: CTA CHEST FINDINGS Cardiovascular: Normal heart size. No pericardial effusion. Normal caliber thoracic aorta without dissection. Negative for acute pulmonary embolism. Mediastinum/Nodes: Trachea and esophagus are unremarkable. No thoracic adenopathy. Lungs/Pleura: Mild atelectasis in the lower lungs. No pleural effusion or pneumothorax. Musculoskeletal: No acute fracture. Review of the MIP images confirms the above findings. CT ABDOMEN and PELVIS FINDINGS Hepatobiliary: No acute abnormality. Pancreas: Unremarkable. Spleen: Unremarkable. Adrenals/Urinary Tract: Normal adrenal glands. No urinary calculi or hydronephrosis. Unremarkable bladder. Stomach/Bowel: Stomach is within normal limits. No bowel obstruction. Extensive diverticulosis about the sigmoid and descending colon. Marked wall thickening and adjacent mild stranding and free fluid about the descending colon compatible with diverticulitis. No free intraperitoneal air. No abscess. Normal appendix. Vascular/Lymphatic: No significant vascular findings are present. No enlarged abdominal or pelvic lymph nodes. Reproductive: Status post hysterectomy. No adnexal masses. Other: None. Musculoskeletal: No acute fracture. Review of the MIP images confirms the above findings. IMPRESSION: 1. Acute uncomplicated diverticulitis of the descending colon. Consider colonoscopy after resolution of acute symptoms to exclude underlying mass. 2. Negative for acute pulmonary embolism. Electronically Signed   By: Norman  Stutzman M.D.   On: 01/18/2024 02:14   DG Chest 2  View Result Date: 01/17/2024 CLINICAL DATA:  Chest pain and shortness of breath EXAM: CHEST - 2 VIEW COMPARISON:  None Available. FINDINGS: Scarring in the left lower lung. The lungs are otherwise clear. No pleural effusion or pneumothorax. Normal cardiomediastinal silhouette. No displaced rib fractures. IMPRESSION: No active cardiopulmonary disease. Electronically Signed   By: Norman Gatlin M.D.   On: 01/17/2024 19:07     Procedures   Medications Ordered in the ED  iohexol (OMNIPAQUE) 350 MG/ML injection 125 mL (125 mLs Intravenous Contrast Given 01/18/24 0201)  amoxicillin-clavulanate (AUGMENTIN) 875-125 MG per tablet 1 tablet (1 tablet Oral Given 01/18/24 0308)                                    Medical Decision Making Amount and/or Complexity of Data Reviewed Labs: ordered. Radiology: ordered.  Risk Prescription drug management.   This patient presents to the ED for concern of palpitations/SHOB, LLQ pain, this involves an extensive number of treatment options, and is a complaint that carries with it a high risk of complications and morbidity.  The differential diagnosis includes but not limited to diverticulitis, ovarian mass, PE, arrhythmia, ACS, thyroid  dysfunction    Co morbidities / Chronic conditions that complicate the patient evaluation  Subclinical hypothyroid, left breast pain   Additional history obtained:  Additional history obtained from EMR External records from outside source obtained and reviewed including prior records on file    Lab Tests:  I Ordered, and personally interpreted labs.  The pertinent results include: Urinalysis with a few ketones and small leukocytes without urinary symptoms.  Troponins are negative and unchanged.  TSH within normal is.  CBC with nonspecific mild leukocytosis at 10.7.  BMP without significant findings.   Imaging Studies ordered:  I ordered imaging studies including CT PE study, CT abdomen pelvis I independently  visualized and interpreted imaging which showed negative for PE; diverticulitis without complication I agree with the radiologist interpretation   Cardiac Monitoring: / EKG:  The patient was maintained on a cardiac monitor.  I personally viewed and interpreted the cardiac monitored which showed an underlying rhythm of: NSR, rate 98   Problem List / ED Course / Critical interventions / Medication management  53 year old female presents with complaint of shortness of breath, feeling like her heart had been running intermittently throughout the day as well as complaint of left lower quadrant pain.  EKG is unremarkable, CT suggesting diverticulitis.  Will treat with Augmentin.  Refer back to PCP for recheck and consideration of colonoscopy.  Referred to cardiology for her palpitations/shortness of breath, negative for PE today. I ordered medication including Augmentin Reevaluation of the patient after these medicines showed that the patient remained stable I have reviewed the patients home medicines and have made adjustments as needed   Social Determinants of Health:  Has PCP   Test / Admission - Considered:  Stable for dc      Final diagnoses:  Diverticulitis  Palpitations  Shortness of breath    ED Discharge Orders          Ordered    amoxicillin-clavulanate (AUGMENTIN) 875-125 MG tablet  Every 12 hours        01/18/24 0301    Ambulatory referral to Cardiology       Comments: If you have not heard from the Cardiology office within the  next 72 hours please call [phone].   01/18/24 0301               Beverley Leita LABOR, PA-C 01/18/24 9688    Haze Lonni PARAS, MD 01/19/24 [phone]

## 2024-05-19 ENCOUNTER — Other Ambulatory Visit: Payer: Self-pay | Admitting: Internal Medicine

## 2024-05-19 DIAGNOSIS — Z1231 Encounter for screening mammogram for malignant neoplasm of breast: Secondary | ICD-10-CM

## 2024-05-20 ENCOUNTER — Ambulatory Visit
Admission: RE | Admit: 2024-05-20 | Discharge: 2024-05-20 | Disposition: A | Discharge status: Home / Self Care | Source: Ambulatory Visit

## 2024-05-20 DIAGNOSIS — Z1231 Encounter for screening mammogram for malignant neoplasm of breast: Secondary | ICD-10-CM
